# Patient Record
Sex: Male | Born: 1965 | Hispanic: Yes | Marital: Married | Smoking: Never smoker
Health system: Southern US, Community
[De-identification: ages and names within clinical notes are randomized; demographics above are authoritative.]

## PROBLEM LIST (undated history)

## (undated) DIAGNOSIS — I10 Essential (primary) hypertension: Secondary | ICD-10-CM

## (undated) DIAGNOSIS — K219 Gastro-esophageal reflux disease without esophagitis: Secondary | ICD-10-CM

## (undated) HISTORY — DX: Gastro-esophageal reflux disease without esophagitis: K21.9

## (undated) HISTORY — PX: OTHER SURGICAL HISTORY: SHX169

## (undated) HISTORY — DX: Essential (primary) hypertension: I10

---

## 2011-06-24 ENCOUNTER — Ambulatory Visit: Payer: Self-pay | Admitting: Family Medicine

## 2012-12-17 IMAGING — US US PELVIS LIMITED
1 series · 17 of 25 positions shown · non-contrast
Comparison: none

REASON FOR EXAM: RT side   testicular mass  tender to touch
COMMENTS:

PROCEDURE:     US  - US TESTICULAR  - June 24, 2011 [DATE]
RESULT:     Comparison: None
TECHNIQUE: Multiple gray-scale, color-flow Doppler, and spectral waveform
tracings of the testicles and testicular vasculature are presented for
review.

[Series 1: us pelvis limited · 17 of 31 slices shown]
[im 1/31]
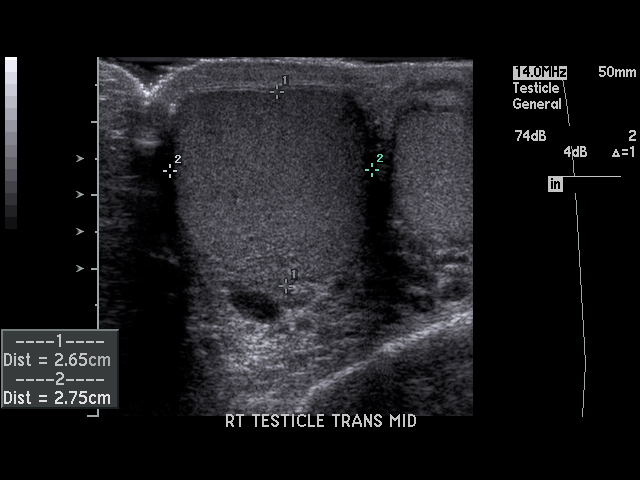
[im 3/31]
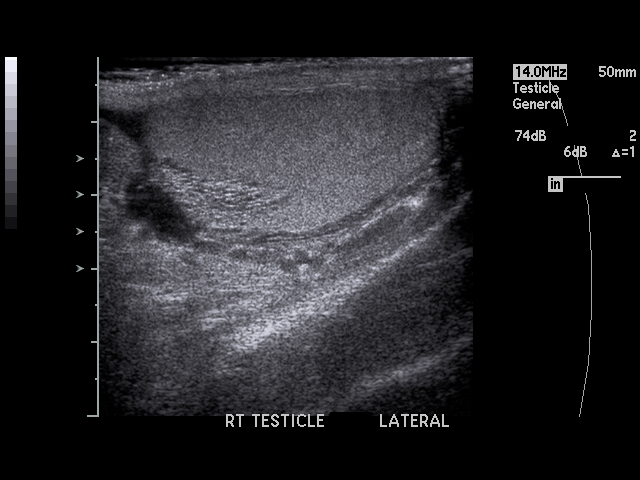
[im 4/31]
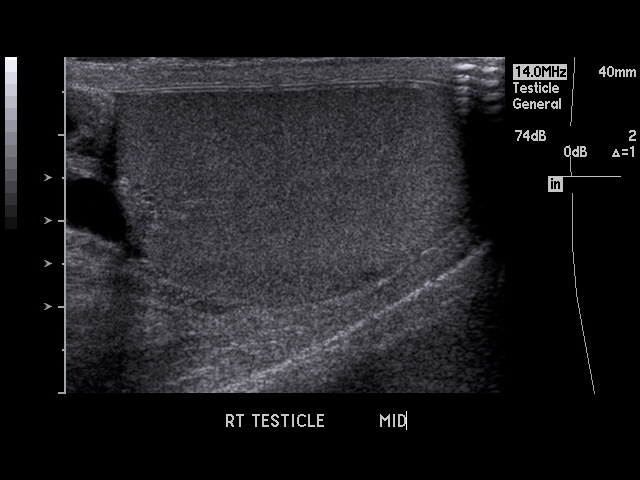
[im 7/31]
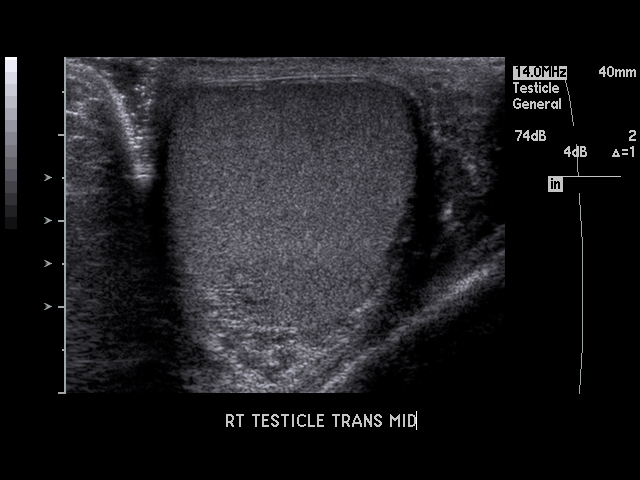
[im 8/31]
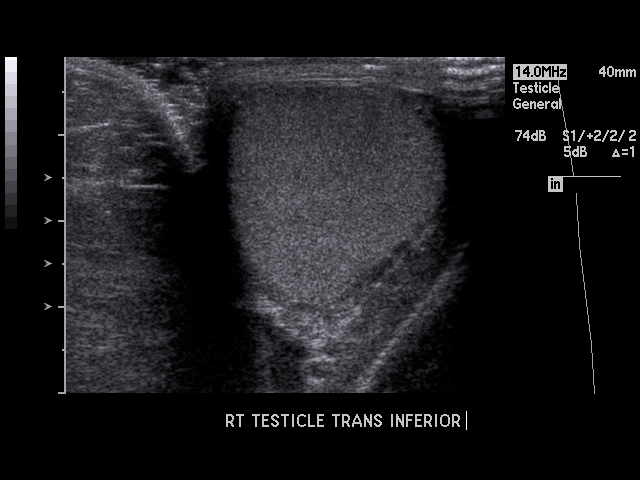
[im 11/31]
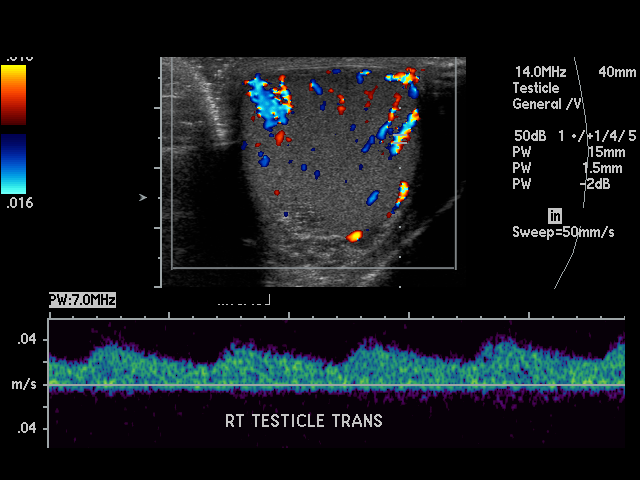
[im 12/31]
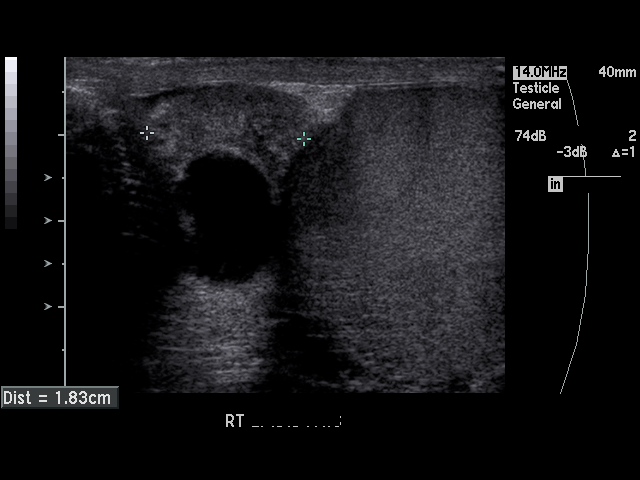
[im 14/31]
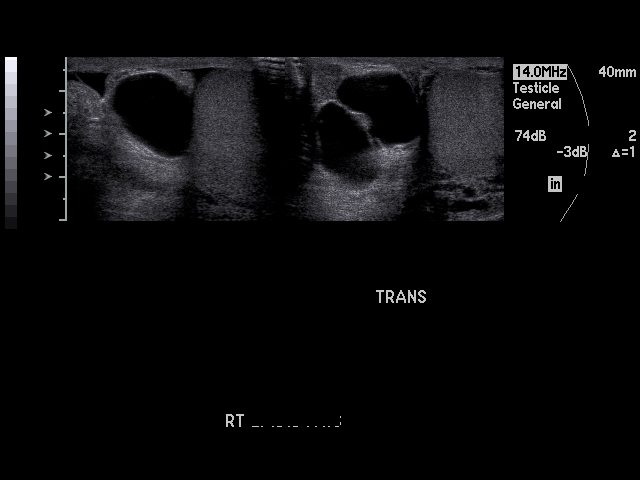
[im 16/31]
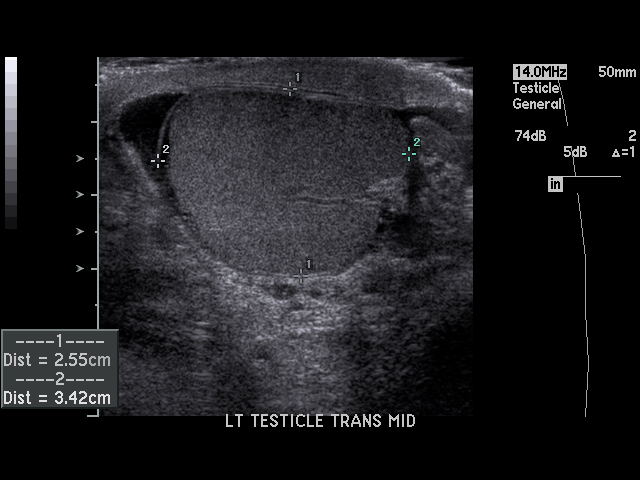
[im 17/31]
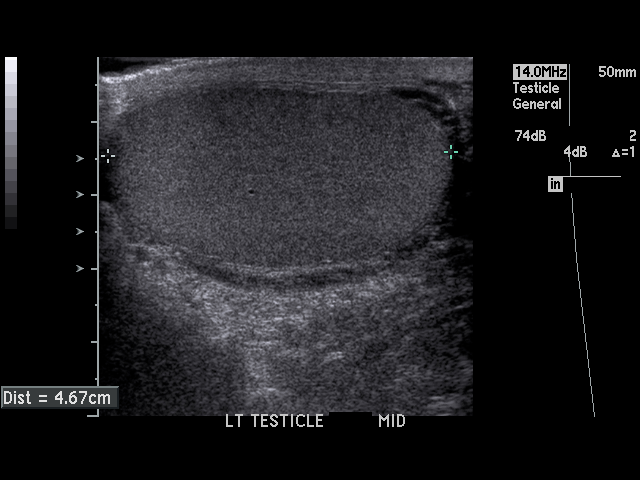
[im 19/31]
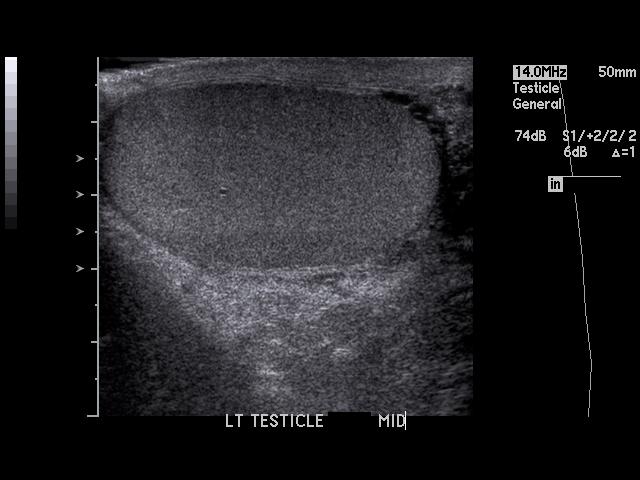
[im 21/31]
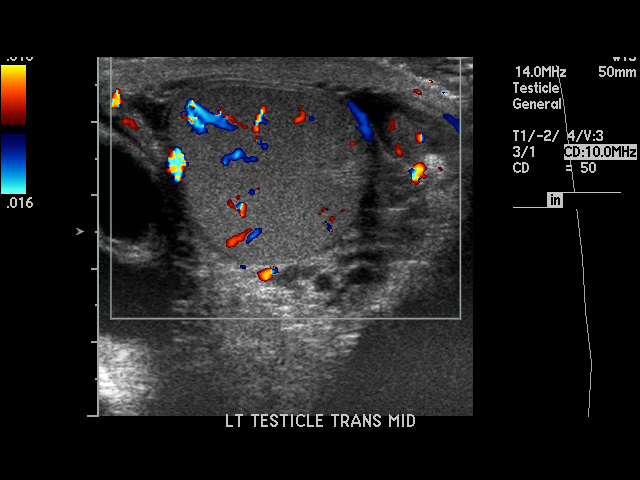
[im 23/31]
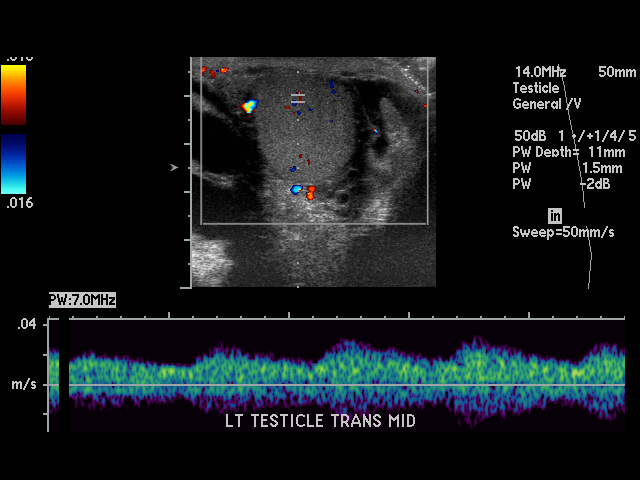
[im 24/31]
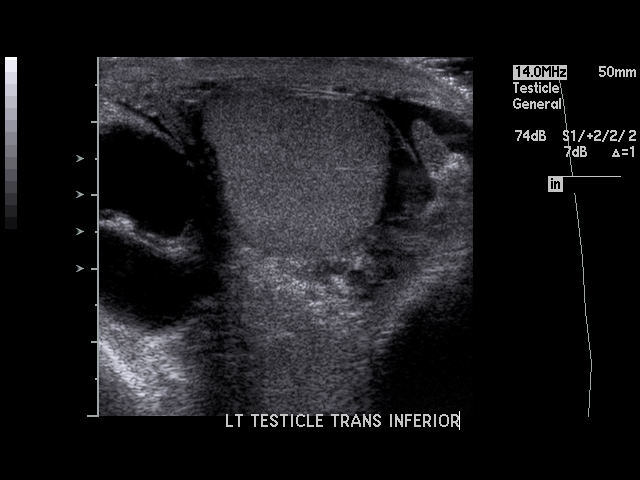
[im 27/31]
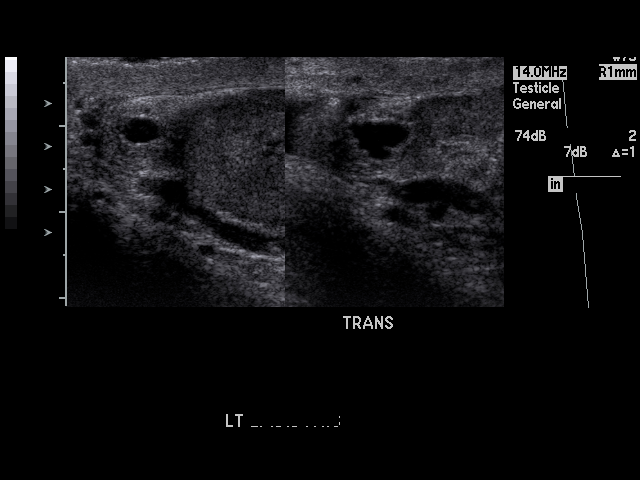
[im 28/31]
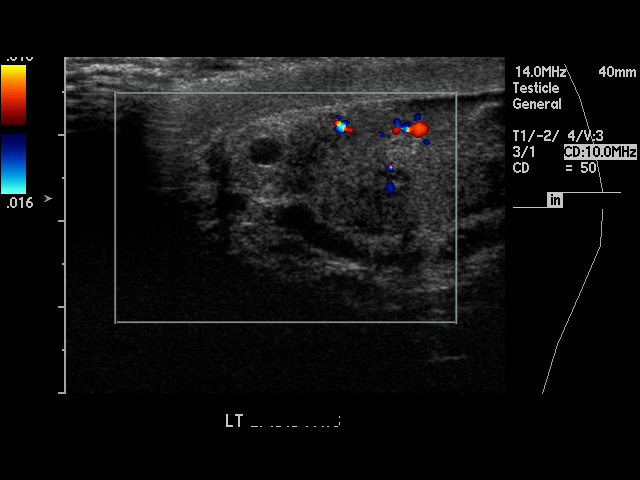
[im 31/31]
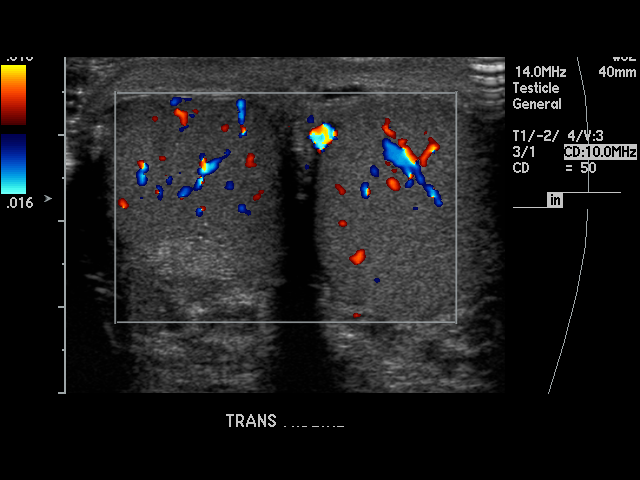

[17 of 25 positions shown; findings below may reference images not displayed]

FINDINGS: The right testicle measures 3.9 x 2.8 x 2.7 cm. The left testicle measures
4.7 x 2.6 x 3.4 cm. The testicles are homogeneous in echotexture. Arterial
and venous spectral Doppler waveforms are demonstrated in the bilateral
testicles. There are 2 cysts in the right epididymis. They measure 2.2 x
x 2.0 cm and 2.1 x 2.1 x 1.6 cm. There is a subcentimeter cyst in the left
epididymis.
IMPRESSION: 1. No testicular mass identified.
2. Epididymal cysts, right greater than left.

## 2014-06-05 ENCOUNTER — Ambulatory Visit (INDEPENDENT_AMBULATORY_CARE_PROVIDER_SITE_OTHER): Payer: Self-pay | Admitting: Family Medicine

## 2014-06-05 VITALS — BP 136/84 | HR 66 | Temp 98.3°F | Resp 16 | Ht 71.0 in | Wt 236.0 lb

## 2014-06-05 DIAGNOSIS — I10 Essential (primary) hypertension: Secondary | ICD-10-CM

## 2014-06-05 DIAGNOSIS — Z0000 Encounter for general adult medical examination without abnormal findings: Secondary | ICD-10-CM

## 2014-06-05 NOTE — Progress Notes (Signed)
[facility] [address], [place] [address] [phone]  Date:  06/05/2014   Name:  Matthew Mullen Mcguire   DOB:  Mar 15, 1966   MRN:  [id number]  PCP:  No primary care provider on file.    Chief Complaint: Annual Exam   History of Present Illness:  Matthew Mullen Jayson is a 49 y.o. 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   History reviewed. No pertinent past medical history.  History reviewed. No pertinent past surgical history.  History  Substance Use Topics  . Smoking status: Never Smoker   . Smokeless tobacco: Not on file  . Alcohol Use: Not on file    Family History  Problem Relation Age of Onset  . Heart disease Mother     No Known Allergies  Medication list has been reviewed and updated.  No current outpatient prescriptions on file prior to visit.   No current facility-administered medications on file prior to visit.    Review of Systems:  As per HPI- otherwise negative.   Physical Examination: Filed Vitals:   06/05/14 1629  BP: 136/84  Pulse: 66  Temp: 98.3 F (36.8 C)  Resp: 16   Filed Vitals:   06/05/14 1629  Height: 5\' 11"  (1.803 m)  Weight: 236 lb (107.049 kg)   Body mass index is 32.93 kg/(m^2). Ideal Body Weight: Weight in (lb) to have BMI = 25: 178.9  GEN: WDWN, NAD, Non-toxic, A & O x 3 HEENT: Atraumatic, Normocephalic. Neck supple. No masses, No LAD.  Bilateral TM wnl, oropharynx normal.  PEERL,EOMI.   Ears and Nose: No external deformity. CV: RRR, No M/G/R. No JVD. No thrill. No extra heart sounds. PULM: CTA B, no wheezes, crackles, rhonchi. No retractions. No resp. distress. No accessory muscle use. ABD: S, NT, ND, +BS. No rebound. No HSM. EXTR: No c/c/e NEURO Normal gait.  PSYCH: Normally interactive.  Conversant. Not depressed or anxious appearing.  Calm demeanor.  Normal strength and DTR all extremities No inguinal hernia   Assessment and Plan: Physical exam  DOT exam- 1 year card due to HTN, controlled   Signed Abbe AmsterdamJessica Jailani Hogans, MD

## 2015-08-14 ENCOUNTER — Encounter: Payer: Self-pay | Admitting: Urology

## 2015-08-14 ENCOUNTER — Ambulatory Visit (INDEPENDENT_AMBULATORY_CARE_PROVIDER_SITE_OTHER): Payer: Self-pay | Admitting: Urology

## 2015-08-14 VITALS — BP 138/92 | HR 66 | Ht 72.0 in | Wt 237.5 lb

## 2015-08-14 DIAGNOSIS — K219 Gastro-esophageal reflux disease without esophagitis: Secondary | ICD-10-CM

## 2015-08-14 DIAGNOSIS — N50819 Testicular pain, unspecified: Secondary | ICD-10-CM

## 2015-08-14 DIAGNOSIS — I10 Essential (primary) hypertension: Secondary | ICD-10-CM

## 2015-08-14 HISTORY — DX: Gastro-esophageal reflux disease without esophagitis: K21.9

## 2015-08-14 HISTORY — DX: Essential (primary) hypertension: I10

## 2015-08-14 LAB — URINALYSIS, COMPLETE
BILIRUBIN UA: NEGATIVE
Glucose, UA: NEGATIVE
Ketones, UA: NEGATIVE
LEUKOCYTES UA: NEGATIVE
Nitrite, UA: NEGATIVE
PH UA: 5.5 (ref 5.0–7.5)
RBC UA: NEGATIVE
Specific Gravity, UA: >1.03 — ABNORMAL HIGH (ref 1.005–1.030)
Urobilinogen, Ur: 0.2 mg/dL (ref 0.2–1.0)

## 2015-08-14 LAB — MICROSCOPIC EXAMINATION
Bacteria, UA: NONE SEEN
Epithelial Cells (non renal): NONE SEEN /hpf (ref 0–10)
RBC, UA: NONE SEEN /hpf (ref 0–?)

## 2015-08-14 MED ORDER — CIPROFLOXACIN HCL 500 MG PO TABS: 500.0000 mg | ORAL_TABLET | Freq: Two times a day (BID) | ORAL | Status: AC

## 2015-08-14 MED ORDER — MELOXICAM 15 MG PO TABS: 15.0000 mg | ORAL_TABLET | Freq: Every day | ORAL | Status: AC

## 2015-08-14 MED ORDER — TAMSULOSIN HCL 0.4 MG PO CAPS: 0.4000 mg | ORAL_CAPSULE | Freq: Every day | ORAL | Status: AC

## 2015-08-14 NOTE — Progress Notes (Signed)
08/14/2015 4:43 PM   Matthew Mullen 1965/07/24 [id number]  Referring provider: No referring provider defined for this encounter.  Chief Complaint  Patient presents with  . Testicle Pain    New Patient    HPI: 50 year old male is referred by Dr. Dorothey Baseman, M.D. for evaluation and management of progressive voiding symptoms and scrotal swelling. The patient states that he has had worsening voiding symptoms for the past 6 weeks. Predominantly describes urinary urgency, associated urge incontinence intermittently, weak stream, and suprapubic pain. The pain seems to be worse when this bladder is full. The patient denies urinary frequency. He denies any dysuria or gross hematuria. He denies any rectal pressure pain with defecation. He denies constipation. He has not had any pain with ejaculation.   Baseline, the patient described a strong stream, empties his bladder completely, and has no urinary urgency, frequency, or incontinence. He is a history of recurrent urinary tract infections.  The patient also states that he intermittently has testicular pain. He underwent scrotal ultrasound partly for years ago and describes a marble-sized area on the back of his testicle. This is slowly grown over time. He has some tenderness to palpation. He has intermittent testicular pain. He is not taking any medications to relieve this pain.    PMH: Past Medical History  Diagnosis Date  . Benign essential HTN 08/14/2015  . Acid reflux 08/14/2015    Surgical History: Past Surgical History  Procedure Laterality Date  . None      Home Medications:    Medication List       This list is accurate as of: 08/14/15  4:43 PM.  Always use your most recent med list.               bisoprolol-hydrochlorothiazide 5-6.25 MG tablet  Commonly known as:  ZIAC  Take 1 tablet by mouth daily.        Allergies: No Known Allergies  Family History: Family History  Problem Relation Age of Onset  .  Heart disease Mother   . Bladder Cancer Neg Hx   . Prostate cancer Neg Hx   . Kidney cancer Neg Hx     Social History:  reports that he has never smoked. He does not have any smokeless tobacco history on file. He reports that he does not drink alcohol or use illicit drugs.  ROS: UROLOGY Frequent Urination?: No Hard to postpone urination?: Yes Burning/pain with urination?: No Get up at night to urinate?: Yes Leakage of urine?: Yes Urine stream starts and stops?: Yes Trouble starting stream?: Yes Do you have to strain to urinate?: No Blood in urine?: No Urinary tract infection?: No Sexually transmitted disease?: No Injury to kidneys or bladder?: No Painful intercourse?: No Weak stream?: No Erection problems?: No Penile pain?: No  Gastrointestinal Nausea?: No Vomiting?: No Indigestion/heartburn?: No Diarrhea?: No Constipation?: No  Constitutional Fever: No Night sweats?: No Weight loss?: No Fatigue?: No  Skin Skin rash/lesions?: No Itching?: No  Eyes Blurred vision?: No Double vision?: No  Ears/Nose/Throat Sore throat?: No Sinus problems?: No  Hematologic/Lymphatic Swollen glands?: No Easy bruising?: No  Cardiovascular Leg swelling?: No Chest pain?: No  Respiratory Cough?: No Shortness of breath?: No  Endocrine Excessive thirst?: No  Musculoskeletal Back pain?: Yes Joint pain?: No  Neurological Headaches?: Yes Dizziness?: No  Psychologic Depression?: No Anxiety?: No  Physical Exam: BP 138/92 mmHg  Pulse 66  Ht 6' (1.829 m)  Wt 237 lb 8 oz (107.729 kg)  BMI 32.20 kg/m2  Constitutional:  Alert and oriented, No acute distress. HEENT:  AT, moist mucus membranes.  Trachea midline, no masses. Cardiovascular: No clubbing, cyanosis, or edema. Respiratory: Normal respiratory effort, no increased work of breathing. GI: Abdomen is soft, nontender, nondistended, no abdominal masses GU: No CVA tenderness. The patient's left testicle is  normal to palpation, the epididymis is also normal and nontender. Spermatic cord is normal. The patient's right testicle is normal but difficult to appreciate completely because of the epididymal cyst. This appears complex in nature. It is tender to palpation. Spermatic cord feels normal. There are no inguinal bulges or evidence of hernia.  Patient's rectal exam demonstrates a tender/boggy prostate, 40 g in size, symmetric, no nodules.  Skin: No rashes, bruises or suspicious lesions. Lymph: No cervical or inguinal adenopathy. Neurologic: Grossly intact, no focal deficits, moving all 4 extremities. Psychiatric: Normal mood and affect.  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 In addition, the patient has prostatitis, acute in nature. Etiology of his prostate is unclear, bacterial versus nonbacterial.  For the patient's complex hydrocele we will plan to obtain a scrotal ultrasound. We will follow up in a month and discuss any treatment options. We did briefly discuss these in which I told him surgery was the most definitive option and was recommended.  In terms of the patient's prostatitis our plan is to treat the patient was separate Floxin 500 mg twice a day times 1 month. In addition I also given the patient meloxicam 15 mg daily times 1 month. I also recommended that the patient start Flomax 0.4 milligrams daily times 1 month. We'll plan to follow-up in a month  with a scrotal ultrasound prior. At that point we can also reassess his prostatitis symptoms.     Return in about 1 month (around 09/14/2015).  Crist FatHERRICK, BENJAMIN W, MD  [facility] [address], [address] [place], [place] [address] [phone]

## 2015-09-14 ENCOUNTER — Ambulatory Visit
Admission: RE | Admit: 2015-09-14 | Discharge: 2015-09-14 | Disposition: A | Discharge status: Home / Self Care | Payer: Self-pay | Source: Ambulatory Visit | Attending: Urology | Admitting: Urology

## 2015-09-14 DIAGNOSIS — N50819 Testicular pain, unspecified: Secondary | ICD-10-CM | POA: Insufficient documentation

## 2015-09-14 DIAGNOSIS — N433 Hydrocele, unspecified: Secondary | ICD-10-CM | POA: Insufficient documentation

## 2015-09-14 DIAGNOSIS — I861 Scrotal varices: Secondary | ICD-10-CM | POA: Insufficient documentation

## 2015-09-14 DIAGNOSIS — N442 Benign cyst of testis: Secondary | ICD-10-CM | POA: Insufficient documentation

## 2015-09-18 ENCOUNTER — Encounter: Payer: Self-pay | Admitting: Urology

## 2015-09-18 ENCOUNTER — Ambulatory Visit (INDEPENDENT_AMBULATORY_CARE_PROVIDER_SITE_OTHER): Payer: Self-pay | Admitting: Urology

## 2015-09-18 VITALS — BP 145/87 | HR 66 | Ht 72.0 in | Wt 239.6 lb

## 2015-09-18 DIAGNOSIS — N4340 Spermatocele of epididymis, unspecified: Secondary | ICD-10-CM

## 2015-09-18 NOTE — Progress Notes (Signed)
50 year old male who presents today for follow-up of decubiti of prostatitis and a complex right epididymal cyst. The patient had significant dysuria, urgency, and progressive lower urinary tract symptoms for quite some time prior to starting the Flomax, Cipro Floxin, and meloxicam times 1 month. At this time the patient states that his symptoms have improved to 95%. He is significantly better in this department. As relates to the patient's complex right epididymal cyst. The patient has undergone an ultrasound confirming that the patient has a right complex spermatocele.  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 In addition, it does interfere with his ability to have intercourse.  Current Outpatient Prescriptions on File Prior to Visit  Medication Sig Dispense Refill  . bisoprolol-hydrochlorothiazide (ZIAC) 5-6.25 MG per tablet Take 1 tablet by mouth daily.    . ciprofloxacin (CIPRO) 500 MG tablet Take 1 tablet (500 mg total) by mouth every 12 (twelve) hours. (Patient not taking: Reported on 09/18/2015) 58 tablet 0  . meloxicam (MOBIC) 15 MG tablet Take 1 tablet (15 mg total) by mouth daily. (Patient not taking: Reported on 09/18/2015) 30 tablet 0  . tamsulosin (FLOMAX) 0.4 MG CAPS capsule Take 1 capsule (0.4 mg total) by mouth daily. (Patient not taking: Reported on 09/18/2015) 30 capsule 2   No current facility-administered medications on file prior to visit.   PE Filed Vitals:   09/18/15 1538  BP: 145/87  Pulse: 66   NAD  Right renal ultrasound: I independently reviewed this as well as gone over the progression. IMPRESSION: 1. Small bilateral testicular cysts. 2. Large right epididymal cysts. 3. No evidence for testicular torsion.  Impression: The patient has significantly improved symptoms from his prostatitis. He requires no additional treatment for this.  He also has a right complex spermatocele. Recommendation: However the patient's ultrasound with him in quite a bit of detail. We then discussed management strategies. Krista BlueI Swain and that this was a benign process and that there were no long-term consequences of leaving this and treated side from the annoyance that he is already experiencing. We did talk about spermatocelectomy, and the patient is eager to consider this. I went over the risk and the benefits of this operation with him. The patient that the biggest risks in his circumstance would be a recurrence given the number of epididymal cyst. Despite this, the patient was initiated in having this treated. However, the patient is self-pay and would like to consider his options along with further prior to aching definitive surgical plans.

## 2018-02-23 IMAGING — US US SCROTUM
1 series · 14 of 25 positions shown · non-contrast
Comparison: 06/24/2011

CLINICAL DATA: Right testicular pain for 3-4 months.

EXAM:
SCROTAL ULTRASOUND
DOPPLER ULTRASOUND OF THE TESTICLES
TECHNIQUE: Complete ultrasound examination of the testicles, epididymis, and
other scrotal structures was performed. Color and spectral Doppler
ultrasound were also utilized to evaluate blood flow to the
testicles.

[Series 1: us scrotum · 0.07mm/px · 14 of 70 slices shown]
[im 1/70]
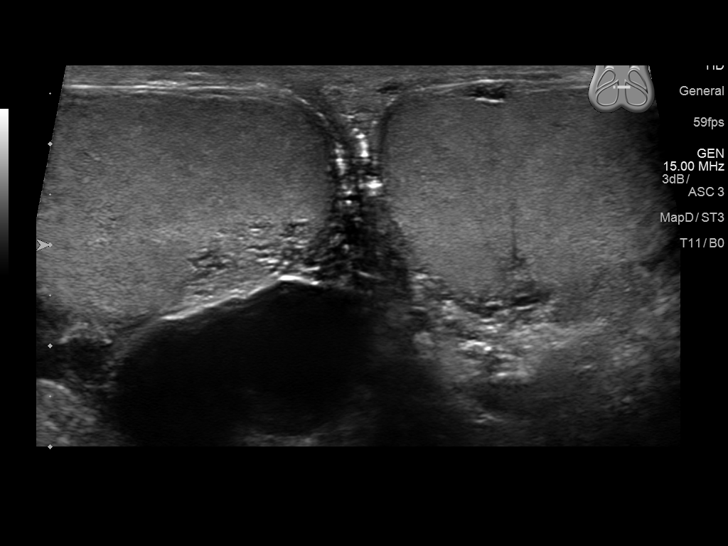
[im 6/70]
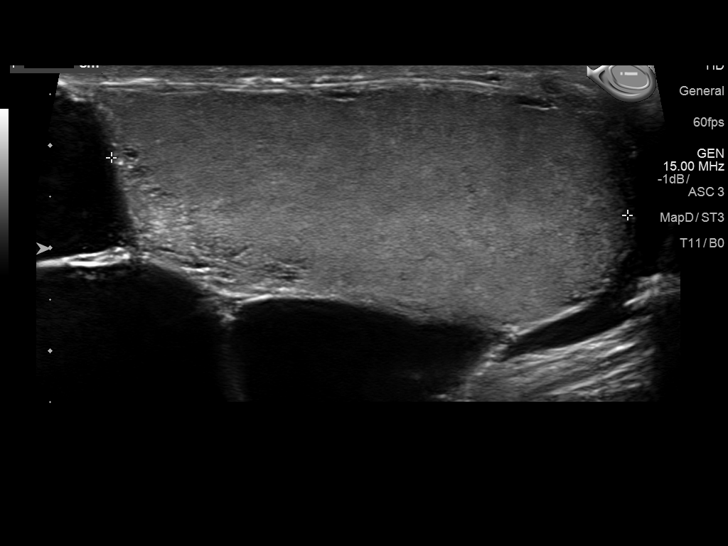
[im 12/70]
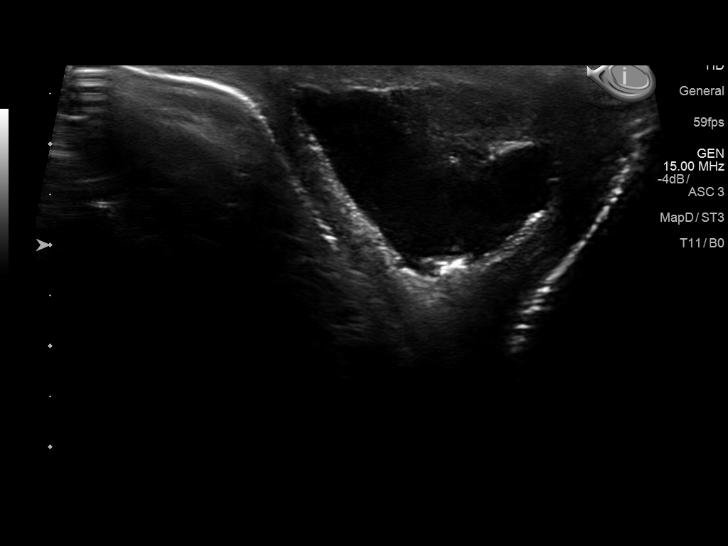
[im 18/70]
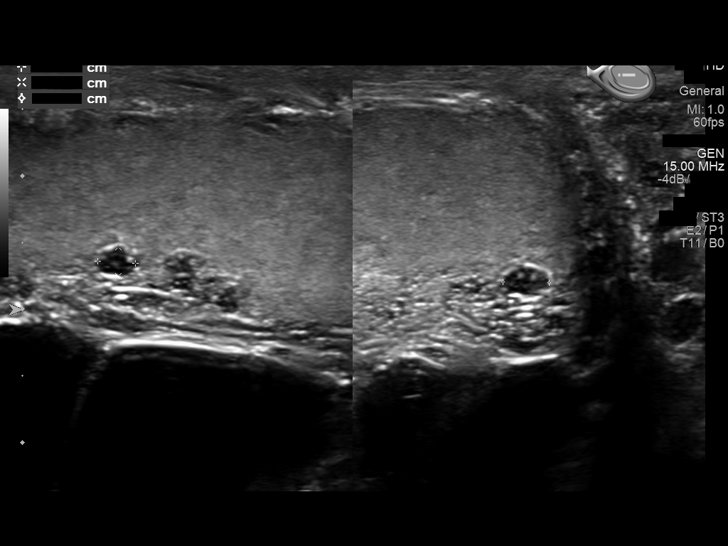
[im 24/70]
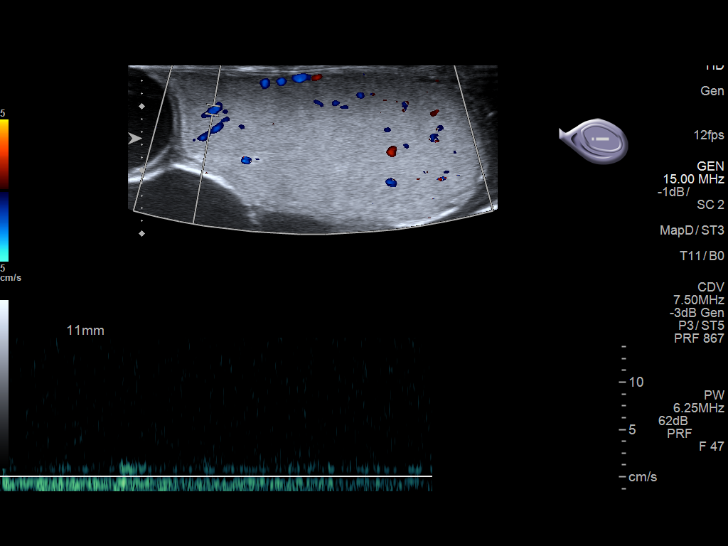
[im 26/70]
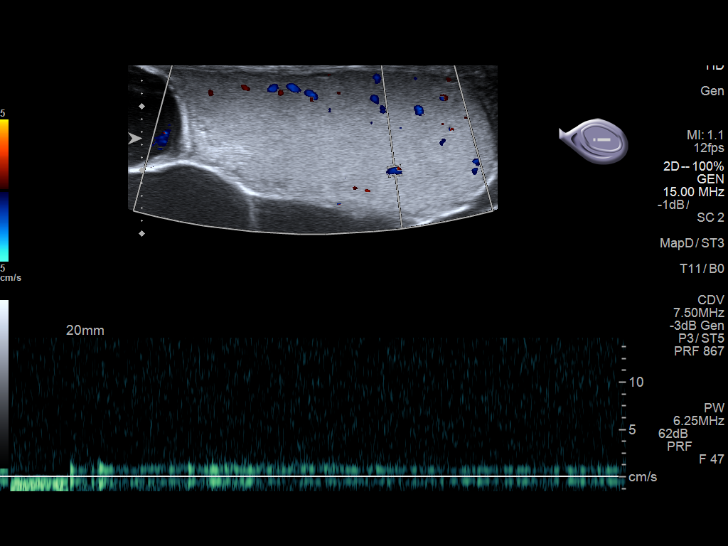
[im 32/70]
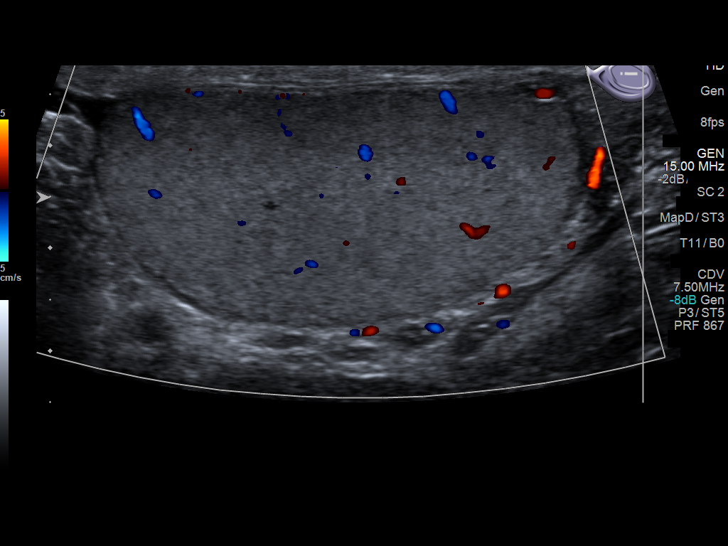
[im 38/70]
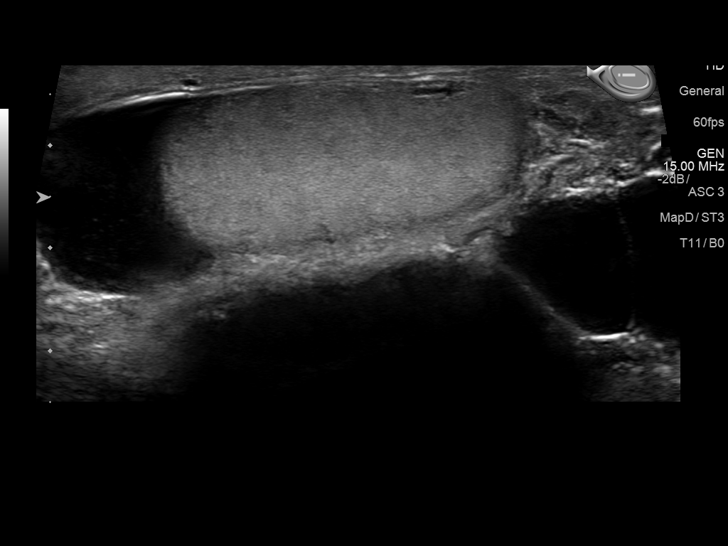
[im 44/70]
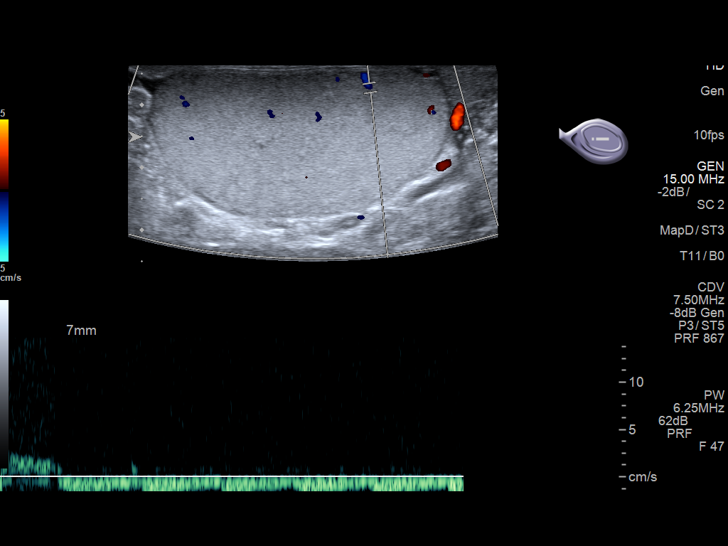
[im 47/70]
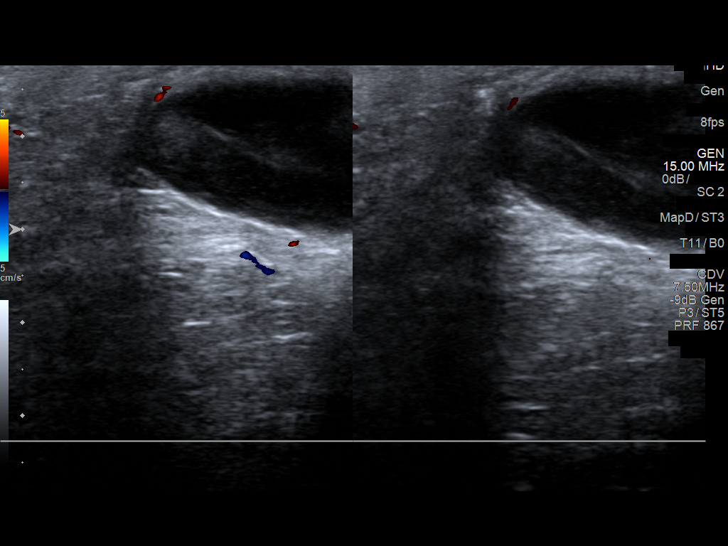
[im 52/70]
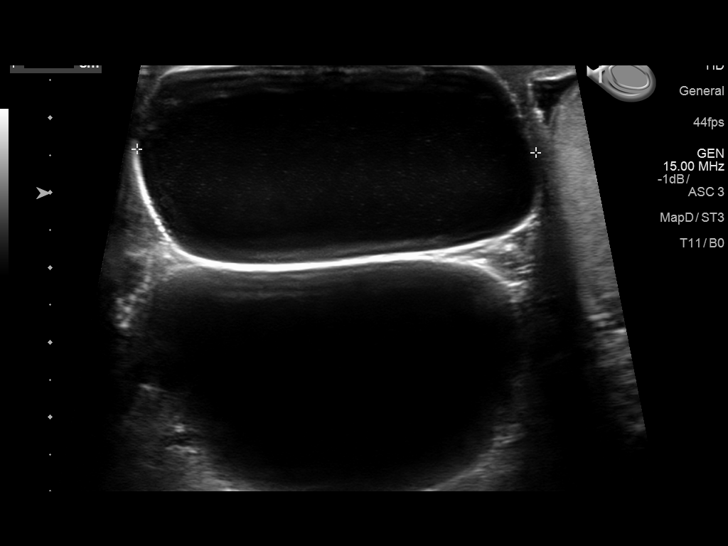
[im 58/70]
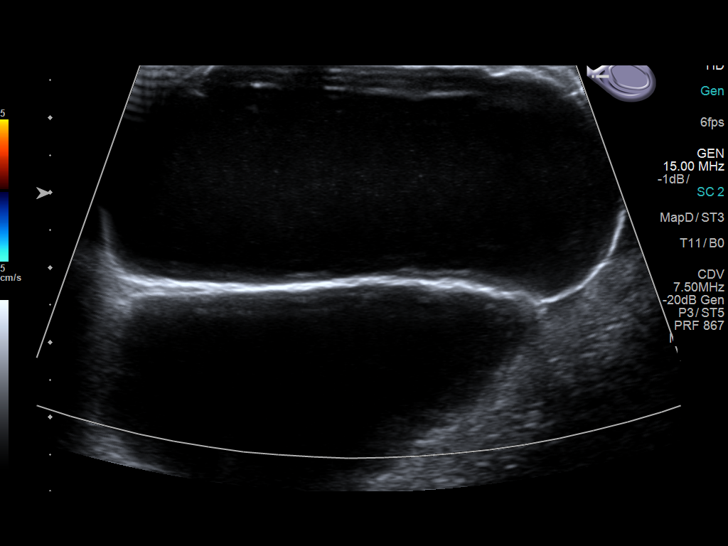
[im 64/70]
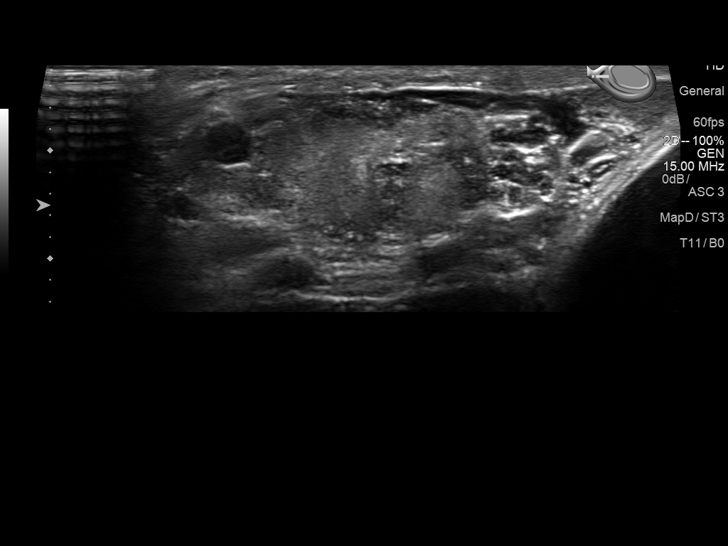
[im 70/70]
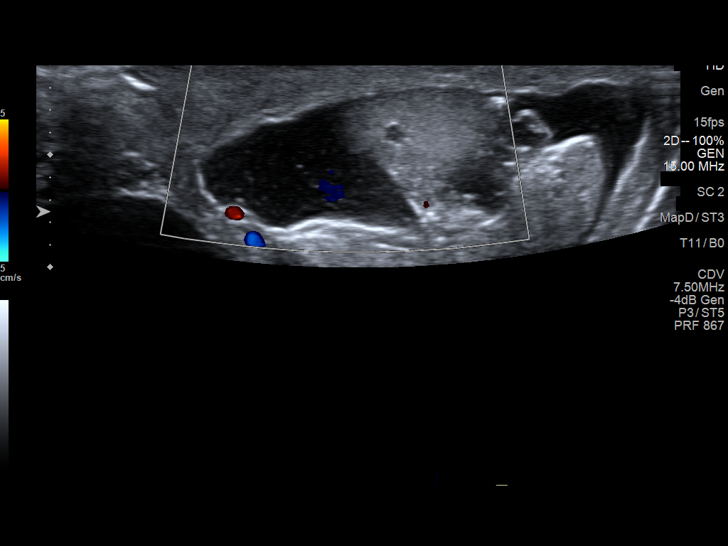

[14 of 25 positions shown; findings below may reference images not displayed]

FINDINGS: Right testicle

Measurements: 5.1 x 2.1 x 3.6 cm. No mass or microlithiasis
visualized. Small cysts are present.

Left testicle

Measurements: 4.9 x 2.6 x 3.4 cm. No mass or microlithiasis
visualized. Small cyst is present.

Right epididymis: Numerous cysts are identified throughout the right
epididymis, largest measuring 5.3 x 2.9 x 6.9 cm. No solid mass.

Left epididymis:  Small epididymal cyst is 0.8 x 0.4 x 0.4 cm.

Hydrocele:  Small bilateral hydroceles are noted.

Varicocele:  Mild left varicocele noted.

Pulsed Doppler interrogation of both testes demonstrates normal low
resistance arterial and venous waveforms bilaterally.
IMPRESSION: 1. Small bilateral testicular cysts.
2. Large right epididymal cysts.
3. No evidence for testicular torsion.

## 2019-09-24 ENCOUNTER — Ambulatory Visit: Payer: Self-pay | Attending: Internal Medicine

## 2019-10-01 ENCOUNTER — Ambulatory Visit: Payer: Self-pay | Attending: Internal Medicine

## 2019-10-01 DIAGNOSIS — Z23 Encounter for immunization: Secondary | ICD-10-CM

## 2019-10-01 NOTE — Progress Notes (Signed)
   Covid-19 Vaccination Clinic  Name:  Willy Pinkerton    MRN: [id number] DOB: 1966-05-10  10/01/2019  Mr. Comer was observed post Covid-19 immunization for 15 minutes without incident. He was provided with Vaccine Information Sheet and instruction to access the V-Safe system.   Mr. Roseboom was instructed to call 911 with any severe reactions post vaccine: Marland Kitchen Difficulty breathing  . Swelling of face and throat  . A fast heartbeat  . A bad rash all over body  . Dizziness and weakness   Immunizations Administered    Name Date Dose VIS Date Route   Pfizer COVID-19 Vaccine 10/01/2019  8:08 AM 0.3 mL 07/20/2018 Intramuscular   Manufacturer: [employer], [employer]   Lot: [id number]   NDC: [address]-5

## 2019-10-22 ENCOUNTER — Ambulatory Visit: Payer: Self-pay | Attending: Internal Medicine

## 2019-10-22 DIAGNOSIS — Z23 Encounter for immunization: Secondary | ICD-10-CM

## 2019-10-22 NOTE — Progress Notes (Signed)
   Covid-19 Vaccination Clinic  Name:  Matthew Mullen    MRN: [id number] DOB: 30-Jun-1965  10/22/2019  Mr. Matthew Mullen was observed post Covid-19 immunization for 15 minutes without incident. He was provided with Vaccine Information Sheet and instruction to access the V-Safe system.   Mr. Matthew Mullen was instructed to call 911 with any severe reactions post vaccine: Marland Kitchen Difficulty breathing  . Swelling of face and throat  . A fast heartbeat  . A bad rash all over body  . Dizziness and weakness   Immunizations Administered    Name Date Dose VIS Date Route   Pfizer COVID-19 Vaccine 10/22/2019  8:30 AM 0.3 mL 07/20/2018 Intramuscular   Manufacturer: [employer], [employer]   Lot: [id number]   NDC: [address]-6
